# Patient Record
Sex: Male | Born: 1990 | Race: Black or African American | Hispanic: No | Marital: Single | Smoking: Current every day smoker
Health system: Southern US, Community
[De-identification: ages and names within clinical notes are randomized; demographics above are authoritative.]

## PROBLEM LIST (undated history)

## (undated) DIAGNOSIS — Z720 Tobacco use: Secondary | ICD-10-CM

## (undated) DIAGNOSIS — I219 Acute myocardial infarction, unspecified: Secondary | ICD-10-CM

## (undated) DIAGNOSIS — I201 Angina pectoris with documented spasm: Secondary | ICD-10-CM

## (undated) DIAGNOSIS — E663 Overweight: Secondary | ICD-10-CM

## (undated) HISTORY — DX: Overweight: E66.3

## (undated) HISTORY — DX: Tobacco use: Z72.0

---

## 2012-05-29 ENCOUNTER — Encounter (HOSPITAL_COMMUNITY): Payer: Self-pay | Admitting: Anesthesiology

## 2012-05-29 ENCOUNTER — Encounter (HOSPITAL_COMMUNITY): Payer: Self-pay | Admitting: *Deleted

## 2012-05-29 ENCOUNTER — Emergency Department (HOSPITAL_COMMUNITY): Payer: Self-pay | Admitting: Anesthesiology

## 2012-05-29 ENCOUNTER — Ambulatory Visit (HOSPITAL_COMMUNITY)
Admission: EM | Admit: 2012-05-29 | Discharge: 2012-05-29 | Disposition: A | Discharge status: Home / Self Care | Payer: Self-pay | Attending: Emergency Medicine | Admitting: Emergency Medicine

## 2012-05-29 ENCOUNTER — Encounter (HOSPITAL_COMMUNITY)
Admission: EM | Disposition: A | Discharge status: Home / Self Care | Payer: Self-pay | Source: Home / Self Care | Attending: Emergency Medicine

## 2012-05-29 ENCOUNTER — Emergency Department (HOSPITAL_COMMUNITY): Payer: Self-pay

## 2012-05-29 DIAGNOSIS — S0230XA Fracture of orbital floor, unspecified side, initial encounter for closed fracture: Secondary | ICD-10-CM | POA: Insufficient documentation

## 2012-05-29 DIAGNOSIS — Y998 Other external cause status: Secondary | ICD-10-CM | POA: Insufficient documentation

## 2012-05-29 DIAGNOSIS — S022XXA Fracture of nasal bones, initial encounter for closed fracture: Secondary | ICD-10-CM | POA: Insufficient documentation

## 2012-05-29 DIAGNOSIS — S02109A Fracture of base of skull, unspecified side, initial encounter for closed fracture: Secondary | ICD-10-CM | POA: Insufficient documentation

## 2012-05-29 DIAGNOSIS — S0180XA Unspecified open wound of other part of head, initial encounter: Secondary | ICD-10-CM | POA: Insufficient documentation

## 2012-05-29 DIAGNOSIS — Y9229 Other specified public building as the place of occurrence of the external cause: Secondary | ICD-10-CM | POA: Insufficient documentation

## 2012-05-29 DIAGNOSIS — S0231XA Fracture of orbital floor, right side, initial encounter for closed fracture: Secondary | ICD-10-CM

## 2012-05-29 HISTORY — PX: ORIF TRIPOD FRACTURE: SHX5211

## 2012-05-29 SURGERY — FESS, WITH MAXILLARY ANTROSTOMY, SEPTOPLASTY, AND ETHMOIDECTOMY
Anesthesia: General | Site: Face | Laterality: Right | Wound class: Clean Contaminated

## 2012-05-29 MED ORDER — HEMOSTATIC AGENTS (NO CHARGE) OPTIME
TOPICAL | Status: DC | PRN
  Administered 2012-05-29: 4 via TOPICAL

## 2012-05-29 MED ORDER — DROPERIDOL 2.5 MG/ML IJ SOLN: 0.6250 mg | INTRAMUSCULAR | Status: DC | PRN

## 2012-05-29 MED ORDER — BSS IO SOLN
INTRAOCULAR | Status: DC | PRN
  Administered 2012-05-29: 1 via INTRAOCULAR

## 2012-05-29 MED ORDER — LIDOCAINE-EPINEPHRINE 1 %-1:100000 IJ SOLN
INTRAMUSCULAR | Status: AC
  Filled 2012-05-29: qty 1

## 2012-05-29 MED ORDER — TETRACAINE HCL 0.5 % OP SOLN
2.0000 [drp] | Freq: Once | OPHTHALMIC | Status: AC
  Administered 2012-05-29: 2 [drp] via OPHTHALMIC
  Filled 2012-05-29: qty 2

## 2012-05-29 MED ORDER — SODIUM CHLORIDE 0.9 % IV SOLN
3.0000 g | Freq: Once | INTRAVENOUS | Status: AC
  Administered 2012-05-29: 3 g via INTRAVENOUS
  Filled 2012-05-29: qty 3

## 2012-05-29 MED ORDER — DEXTROSE-NACL 5-0.45 % IV SOLN: INTRAVENOUS | Status: DC

## 2012-05-29 MED ORDER — DEXAMETHASONE SODIUM PHOSPHATE 10 MG/ML IJ SOLN
10.0000 mg | Freq: Once | INTRAMUSCULAR | Status: DC
Start: 2012-05-29 — End: 2012-05-29

## 2012-05-29 MED ORDER — OXYMETAZOLINE HCL 0.05 % NA SOLN
NASAL | Status: DC | PRN
  Administered 2012-05-29: 1 via NASAL

## 2012-05-29 MED ORDER — LACTATED RINGERS IV SOLN: INTRAVENOUS | Status: DC | PRN

## 2012-05-29 MED ORDER — LIDOCAINE-EPINEPHRINE (PF) 1 %-1:200000 IJ SOLN
INTRAMUSCULAR | Status: DC | PRN
  Administered 2012-05-29: 7 mL

## 2012-05-29 MED ORDER — MORPHINE SULFATE 2 MG/ML IJ SOLN: 1.0000 mg | INTRAMUSCULAR | Status: DC | PRN

## 2012-05-29 MED ORDER — HYDROMORPHONE HCL PF 1 MG/ML IJ SOLN
INTRAMUSCULAR | Status: AC
  Filled 2012-05-29: qty 1

## 2012-05-29 MED ORDER — OXYCODONE HCL 5 MG/5ML PO SOLN
5.0000 mg | Freq: Once | ORAL | Status: DC | PRN
Start: 2012-05-29 — End: 2012-05-29

## 2012-05-29 MED ORDER — FLUORESCEIN SODIUM 1 MG OP STRP
2.0000 | ORAL_STRIP | Freq: Once | OPHTHALMIC | Status: AC
  Administered 2012-05-29: 2 via OPHTHALMIC
  Filled 2012-05-29: qty 2

## 2012-05-29 MED ORDER — LACTATED RINGERS IV SOLN
INTRAVENOUS | Status: DC | PRN
  Administered 2012-05-29: 12:00:00 via INTRAVENOUS

## 2012-05-29 MED ORDER — ONDANSETRON HCL 4 MG/2ML IJ SOLN
4.0000 mg | Freq: Once | INTRAMUSCULAR | Status: AC
  Administered 2012-05-29: 4 mg via INTRAVENOUS
  Filled 2012-05-29: qty 2

## 2012-05-29 MED ORDER — ROCURONIUM BROMIDE 100 MG/10ML IV SOLN
INTRAVENOUS | Status: DC | PRN
  Administered 2012-05-29: 30 mg via INTRAVENOUS
  Administered 2012-05-29: 10 mg via INTRAVENOUS

## 2012-05-29 MED ORDER — SUCCINYLCHOLINE CHLORIDE 20 MG/ML IJ SOLN
INTRAMUSCULAR | Status: DC | PRN
  Administered 2012-05-29: 140 mg via INTRAVENOUS

## 2012-05-29 MED ORDER — SODIUM CHLORIDE 0.9 % IV SOLN
INTRAVENOUS | Status: DC | PRN
  Administered 2012-05-29: 11:00:00 via INTRAVENOUS

## 2012-05-29 MED ORDER — 0.9 % SODIUM CHLORIDE (POUR BTL) OPTIME
TOPICAL | Status: DC | PRN
  Administered 2012-05-29: 1000 mL

## 2012-05-29 MED ORDER — PROPOFOL 10 MG/ML IV EMUL
INTRAVENOUS | Status: DC | PRN
  Administered 2012-05-29: 150 mg via INTRAVENOUS

## 2012-05-29 MED ORDER — OXYCODONE HCL 5 MG PO TABS: 5.0000 mg | ORAL_TABLET | Freq: Once | ORAL | Status: DC | PRN

## 2012-05-29 MED ORDER — HYDROMORPHONE HCL PF 1 MG/ML IJ SOLN
0.2500 mg | INTRAMUSCULAR | Status: DC | PRN
  Administered 2012-05-29 (×2): 0.5 mg via INTRAVENOUS

## 2012-05-29 MED ORDER — MIDAZOLAM HCL 5 MG/5ML IJ SOLN
INTRAMUSCULAR | Status: DC | PRN
  Administered 2012-05-29: 2 mg via INTRAVENOUS

## 2012-05-29 MED ORDER — LIDOCAINE HCL (CARDIAC) 20 MG/ML IV SOLN
INTRAVENOUS | Status: DC | PRN
  Administered 2012-05-29: 80 mg via INTRAVENOUS

## 2012-05-29 MED ORDER — GLYCOPYRROLATE 0.2 MG/ML IJ SOLN
INTRAMUSCULAR | Status: DC | PRN
  Administered 2012-05-29: 0.4 mg via INTRAVENOUS

## 2012-05-29 MED ORDER — BACITRACIN ZINC 500 UNIT/GM EX OINT
TOPICAL_OINTMENT | CUTANEOUS | Status: DC | PRN
  Administered 2012-05-29: 1 via TOPICAL

## 2012-05-29 MED ORDER — ONDANSETRON HCL 4 MG/2ML IJ SOLN
INTRAMUSCULAR | Status: DC | PRN
  Administered 2012-05-29: 4 mg via INTRAVENOUS

## 2012-05-29 MED ORDER — FENTANYL CITRATE 0.05 MG/ML IJ SOLN
INTRAMUSCULAR | Status: DC | PRN
  Administered 2012-05-29 (×2): 100 ug via INTRAVENOUS
  Administered 2012-05-29: 50 ug via INTRAVENOUS

## 2012-05-29 MED ORDER — BSS IO SOLN
INTRAOCULAR | Status: AC
  Filled 2012-05-29: qty 500

## 2012-05-29 MED ORDER — BSS IO SOLN
INTRAOCULAR | Status: AC
  Filled 2012-05-29: qty 15

## 2012-05-29 MED ORDER — OXYMETAZOLINE HCL 0.05 % NA SOLN
NASAL | Status: AC
  Filled 2012-05-29: qty 15

## 2012-05-29 MED ORDER — BACITRACIN ZINC 500 UNIT/GM EX OINT
TOPICAL_OINTMENT | CUTANEOUS | Status: AC
  Filled 2012-05-29: qty 15

## 2012-05-29 MED ORDER — NEOSTIGMINE METHYLSULFATE 1 MG/ML IJ SOLN
INTRAMUSCULAR | Status: DC | PRN
  Administered 2012-05-29: 3 mg via INTRAVENOUS

## 2012-05-29 SURGICAL SUPPLY — 31 items
ATTRACTOMAT 16X20 MAGNETIC DRP (DRAPES) ×2 IMPLANT
BLADE ROTATE TRICUT 4X13 M4 (BLADE) ×2 IMPLANT
CANISTER SUCTION 2500CC (MISCELLANEOUS) ×2 IMPLANT
CLOTH BEACON ORANGE TIMEOUT ST (SAFETY) ×2 IMPLANT
COAGULATOR SUCT SWTCH 10FR 6 (ELECTROSURGICAL) ×2 IMPLANT
CORDS BIPOLAR (ELECTRODE) ×2 IMPLANT
CRADLE DONUT ADULT HEAD (MISCELLANEOUS) ×2 IMPLANT
ELECT COATED BLADE 2.86 ST (ELECTRODE) ×2 IMPLANT
FILTER ARTHROSCOPY CONVERTOR (FILTER) ×2 IMPLANT
GLOVE SURG SS PI 7.5 STRL IVOR (GLOVE) ×2 IMPLANT
GOWN STRL NON-REIN LRG LVL3 (GOWN DISPOSABLE) ×4 IMPLANT
KIT BASIN OR (CUSTOM PROCEDURE TRAY) ×2 IMPLANT
NEEDLE SPNL 20GX3.5 QUINCKE YW (NEEDLE) ×2 IMPLANT
NS IRRIG 1000ML POUR BTL (IV SOLUTION) ×2 IMPLANT
PAD ARMBOARD 7.5X6 YLW CONV (MISCELLANEOUS) ×2 IMPLANT
PAD ENT ADHESIVE 25PK (MISCELLANEOUS) ×2 IMPLANT
PATTIES SURGICAL .5 X3 (DISPOSABLE) ×2 IMPLANT
PENCIL BUTTON HOLSTER BLD 10FT (ELECTRODE) ×2 IMPLANT
PENCIL FOOT CONTROL (ELECTRODE) ×2 IMPLANT
SHEATH ENDOSCRUB 45 DEG (SHEATH) ×2 IMPLANT
SOLUTION ANTI FOG 6CC (MISCELLANEOUS) ×2 IMPLANT
SPLINT NASAL DOYLE BI-VL (GAUZE/BANDAGES/DRESSINGS) ×2 IMPLANT
SUT CHROMIC 5 0 P 3 (SUTURE) ×2 IMPLANT
SUT PROLENE 5 0 C 1 36 (SUTURE) ×2 IMPLANT
SUT VIC AB 4-0 PS2 27 (SUTURE) ×2 IMPLANT
SYRINGE 10CC LL (SYRINGE) ×2 IMPLANT
TOWEL OR 17X26 10 PK STRL BLUE (TOWEL DISPOSABLE) ×4 IMPLANT
TRACKER ENT INSTRUMENT (MISCELLANEOUS) ×2 IMPLANT
TRACKER ENT PATIENT (MISCELLANEOUS) ×2 IMPLANT
TRAY ENT MC OR (CUSTOM PROCEDURE TRAY) ×2 IMPLANT
TUBE CONNECTING 12X1/4 (SUCTIONS) ×2 IMPLANT
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
## 2012-05-29 NOTE — ED Notes (Signed)
Admitting MD at bedside.

## 2012-05-29 NOTE — ED Notes (Signed)
Pt had to return to CT for further pictures. Pt picture was cut off a little
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
## 2012-05-29 NOTE — ED Notes (Signed)
ENT MD at bedside.

## 2012-05-29 NOTE — Op Note (Signed)
05/29/2012  1:34 PM    Francisco Perez  [id number]   Pre-Op Dx:  Right nasal fracture, right orbital floor blowout fracture  Post-op Dx: Right nasal fracture, right orbital floor blowout fracture  Proc: open reduction and internal fixation of right orbital floor fracture, subciliary approach Closed reduction of nasal fracture with stabilization  Surg:  Shellia Cleverly:  GET  EBL:  <34mL  Comp:  none  Findings:  Right orbital contents well-reduced, no inferior rectus injury, severe nasal swelling, good reduction of nasal fracture. No globe, cornea, pupil, or optic nerve injury.  Drains: nasal Doyle splints  Procedure: With the patient in a comfortable supine position, general endotracheal anesthesia was induced without difficulty.  Afrin pledgets were placed to decongest the nose. The Afrin pledgets were then removed. The image guidance hardware was placed and the patient was registered to the [employer] Fusion image guidance CT.   Nasal endoscopy was performed with the zero degree scope. The septum was mobile and the nasal tissues were edematous. The right lateral nasal wall/nasal dorsum was depressed and fractured from the trauma. I gently reduced the nasal bones outward to their native position, outfractured the turbinates, and I reduced the septum to midline. It was clear that adequate exposure for a transmaxillary ORIF of the right orbital floor fracture could not be obtained, so I removed the endoscope and image guidance hardware. I placed bilateral nasal Doyle splints to bolster the nasal reduction and secured these with a collumellar 3-0 Nylon stitch in the standard fashion after suctioning out the nose.  The face was prepped and draped with betadine and the eyes were protected appropriately. I placed a right temporary tarsorrhaphy with 6-0 prolene.   1% Xylocaine with 1:100,000 epinephrine was infiltrated into the right infraorbital area in the standard fashion. I marked  out a natural subciliary skin crease and incised this down to the subcutaneous tissue with the 15 blade. The muscular layer was divided in the standard fashion and the orbital septum was bluntly dissected away. I identified the right bony orbital rim/floor, and divided the periosteum with the Bovie. I used a hand-over-hand technique with the Cottle elevator and suction to gently dissect periosteum off of the fracture orbital floor. I dissected posteriorly until the entire fracture was exposed. I gently reduced the inferior rectus, which was intact, the periorbital fat, and the periosteum superiorly and back into the orbit. I held the orbital contents superiorly and fashioned a ~3 x 4cm stacked 5-layer piece of Gelfilm. I gently placed this 5-layer Gelfilm inferior to the reduced orbital contents and superior to the bony orbital floor, spanning the fractured orbital floor and resting on the stable lateral bony floor and anterior bony rim. Excellent reduction was noted. I then removed the tarsorrhaphy and performed forced duction testing in the standard fashion and confirmed good superior, inferior, lateral, and medial motion of the right globe and no evidence of entrapment on forced duction. Care was taken not to injure the cornea. I carefully irrigated out the subciliary incision and then closed the muscular layer loosely with deep, buried 4-0 vicryl, and closed the skin with interrupted simple 5-0 chromic gut. The stomach was suctioned out and the face cleaned off. The eyes were gently rinsed with balanced saline solution (sterile). Again good reduction was noted with no rectus muscle injury or entrapment.  The patient was returned to anesthesia, fully awakened and extubated.  The patient was transferred to the postoperative recovery area in stable condition.  Dr.  Melvenia Beam was present and performed the entire procedure.   Melvenia Beam 1:34 PM 05/29/12

## 2012-05-29 NOTE — ED Notes (Addendum)
EDP at bedside  

## 2012-05-29 NOTE — Transfer of Care (Signed)
Immediate Anesthesia Transfer of Care Note  Patient: Francisco Perez  Procedure(s) Performed: Procedure(s) (LRB): SEPTOPLASTY WITH ETHMOIDECTOMY, AND MAXILLARY ANTROSTOMY (Right) OPEN REDUCTION INTERNAL FIXATION (ORIF) TRIPOD FRACTURE (Right)  Patient Location: PACU  Anesthesia Type: General  Level of Consciousness: awake, alert , oriented and patient cooperative  Airway & Oxygen Therapy: Patient Spontanous Breathing and Patient connected to face mask oxygen  Post-op Assessment: Report given to PACU RN, Post -op Vital signs reviewed and stable and Patient moving all extremities  Post vital signs: Reviewed and stable  Complications: No apparent anesthesia complications

## 2012-05-29 NOTE — Anesthesia Procedure Notes (Signed)
Procedure Name: Intubation Date/Time: 05/29/2012 11:15 AM Performed by: Jerilee Hoh Pre-anesthesia Checklist: Patient identified, Emergency Drugs available, Suction available and Patient being monitored Patient Re-evaluated:Patient Re-evaluated prior to inductionOxygen Delivery Method: Circle system utilized Preoxygenation: Pre-oxygenation with 100% oxygen Intubation Type: Cricoid Pressure applied, Rapid sequence and IV induction Laryngoscope Size: Mac and 4 Grade View: Grade I Tube type: Oral Tube size: 7.5 mm Number of attempts: 1 Airway Equipment and Method: Stylet Placement Confirmation: ETT inserted through vocal cords under direct vision,  positive ETCO2 and breath sounds checked- equal and bilateral Secured at: 22 cm Tube secured with: Tape Dental Injury: Teeth and Oropharynx as per pre-operative assessment  Comments: RSI with cricoid pressure held.  Suctioned blood from oropharynx prior to inserting ETT.  Grade I view.  Atraumatic intubation.

## 2012-05-29 NOTE — ED Notes (Signed)
EDP removed C-collar
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
## 2012-05-29 NOTE — Preoperative (Signed)
Beta Blockers   Reason not to administer Beta Blockers:Not Applicable 

## 2012-05-29 NOTE — ED Notes (Signed)
Attempted to perform wound care; Patient began vomiting; PA informed

## 2012-05-29 NOTE — Anesthesia Postprocedure Evaluation (Signed)
Anesthesia Post Note  Patient: Francisco Perez  Procedure(s) Performed: Procedure(s) (LRB): SEPTOPLASTY WITH ETHMOIDECTOMY, AND MAXILLARY ANTROSTOMY (Right) OPEN REDUCTION INTERNAL FIXATION (ORIF) TRIPOD FRACTURE (Right)  Anesthesia type: general  Patient location: PACU  Post pain: Pain level controlled  Post assessment: Patient's Cardiovascular Status Stable  Last Vitals:  Filed Vitals:   05/29/12 1538  BP:   Pulse: 70  Temp:   Resp: 15    Post vital signs: Reviewed and stable  Level of consciousness: sedated  Complications: No apparent anesthesia complications

## 2012-06-01 ENCOUNTER — Encounter (HOSPITAL_COMMUNITY): Payer: Self-pay | Admitting: Otolaryngology

## 2012-10-01 ENCOUNTER — Emergency Department: Payer: Self-pay | Admitting: Emergency Medicine

## 2012-10-05 ENCOUNTER — Emergency Department: Payer: Self-pay | Admitting: Emergency Medicine

## 2012-10-05 LAB — CBC WITH DIFFERENTIAL/PLATELET
Eosinophil #: 0.1 10*3/uL (ref 0.0–0.7)
Eosinophil %: 1.1 %
HGB: 13.8 g/dL (ref 13.0–18.0)
Lymphocyte #: 2.9 10*3/uL (ref 1.0–3.6)
MCHC: 33.1 g/dL (ref 32.0–36.0)
Neutrophil #: 7.5 10*3/uL — ABNORMAL HIGH (ref 1.4–6.5)
Platelet: 215 10*3/uL (ref 150–440)
RBC: 4.56 10*6/uL (ref 4.40–5.90)
RDW: 13.4 % (ref 11.5–14.5)

## 2012-10-05 LAB — BASIC METABOLIC PANEL
Anion Gap: 4 — ABNORMAL LOW (ref 7–16)
Chloride: 101 mmol/L (ref 98–107)
Creatinine: 1 mg/dL (ref 0.60–1.30)
EGFR (African American): >60
EGFR (Non-African Amer.): >60
Glucose: 90 mg/dL (ref 65–99)
Osmolality: 275 (ref 275–301)
Potassium: 4.3 mmol/L (ref 3.5–5.1)
Sodium: 137 mmol/L (ref 136–145)

## 2016-01-13 ENCOUNTER — Encounter (HOSPITAL_COMMUNITY)
Admission: AD | Disposition: A | Discharge status: Home / Self Care | Payer: Self-pay | Source: Other Acute Inpatient Hospital | Attending: Cardiovascular Disease

## 2016-01-13 ENCOUNTER — Emergency Department: Payer: Self-pay

## 2016-01-13 ENCOUNTER — Encounter: Payer: Self-pay | Admitting: Emergency Medicine

## 2016-01-13 ENCOUNTER — Inpatient Hospital Stay (HOSPITAL_COMMUNITY)
Admission: AD | Admit: 2016-01-13 | Discharge: 2016-01-14 | DRG: 287 | Disposition: A | Discharge status: Home / Self Care | Payer: Self-pay | Source: Other Acute Inpatient Hospital | Attending: Cardiovascular Disease | Admitting: Cardiovascular Disease

## 2016-01-13 ENCOUNTER — Emergency Department
Admission: EM | Admit: 2016-01-13 | Discharge: 2016-01-13 | Disposition: A | Discharge status: Home / Self Care | Payer: Self-pay | Attending: Emergency Medicine | Admitting: Emergency Medicine

## 2016-01-13 ENCOUNTER — Inpatient Hospital Stay (HOSPITAL_COMMUNITY): Payer: Self-pay

## 2016-01-13 DIAGNOSIS — F17210 Nicotine dependence, cigarettes, uncomplicated: Secondary | ICD-10-CM | POA: Insufficient documentation

## 2016-01-13 DIAGNOSIS — R9431 Abnormal electrocardiogram [ECG] [EKG]: Secondary | ICD-10-CM | POA: Diagnosis present

## 2016-01-13 DIAGNOSIS — I2119 ST elevation (STEMI) myocardial infarction involving other coronary artery of inferior wall: Secondary | ICD-10-CM

## 2016-01-13 DIAGNOSIS — I472 Ventricular tachycardia: Secondary | ICD-10-CM | POA: Diagnosis present

## 2016-01-13 DIAGNOSIS — R7989 Other specified abnormal findings of blood chemistry: Secondary | ICD-10-CM

## 2016-01-13 DIAGNOSIS — I2510 Atherosclerotic heart disease of native coronary artery without angina pectoris: Secondary | ICD-10-CM

## 2016-01-13 DIAGNOSIS — F17200 Nicotine dependence, unspecified, uncomplicated: Secondary | ICD-10-CM | POA: Diagnosis present

## 2016-01-13 DIAGNOSIS — I201 Angina pectoris with documented spasm: Principal | ICD-10-CM | POA: Diagnosis present

## 2016-01-13 DIAGNOSIS — R778 Other specified abnormalities of plasma proteins: Secondary | ICD-10-CM | POA: Diagnosis present

## 2016-01-13 DIAGNOSIS — Z720 Tobacco use: Secondary | ICD-10-CM

## 2016-01-13 DIAGNOSIS — R079 Chest pain, unspecified: Secondary | ICD-10-CM

## 2016-01-13 HISTORY — DX: Angina pectoris with documented spasm: I20.1

## 2016-01-13 HISTORY — PX: CARDIAC CATHETERIZATION: SHX172

## 2016-01-13 LAB — ECHOCARDIOGRAM COMPLETE
Height: 72 in
WEIGHTICAEL: 3360 [oz_av]

## 2016-01-13 LAB — POCT I-STAT, CHEM 8
BUN: 13 mg/dL (ref 6–20)
CREATININE: 0.7 mg/dL (ref 0.61–1.24)
Calcium, Ion: 1.13 mmol/L (ref 1.12–1.23)
Chloride: 104 mmol/L (ref 101–111)
Glucose, Bld: 101 mg/dL — ABNORMAL HIGH (ref 65–99)
HEMATOCRIT: 44 % (ref 39.0–52.0)
HEMOGLOBIN: 15 g/dL (ref 13.0–17.0)
POTASSIUM: 4.2 mmol/L (ref 3.5–5.1)
Sodium: 141 mmol/L (ref 135–145)
TCO2: 23 mmol/L (ref 0–100)

## 2016-01-13 LAB — CBC
HEMATOCRIT: 42.4 % (ref 40.0–52.0)
Hemoglobin: 14.3 g/dL (ref 13.0–18.0)
MCH: 31 pg (ref 26.0–34.0)
MCHC: 33.8 g/dL (ref 32.0–36.0)
MCV: 91.6 fL (ref 80.0–100.0)
PLATELETS: 240 10*3/uL (ref 150–440)
RBC: 4.63 MIL/uL (ref 4.40–5.90)
RDW: 13.3 % (ref 11.5–14.5)
WBC: 14.3 10*3/uL — AB (ref 3.8–10.6)

## 2016-01-13 LAB — TROPONIN I
TROPONIN I: 0.54 ng/mL — AB (ref <0.031)
TROPONIN I: 6.14 ng/mL — AB (ref <0.031)
Troponin I: 4.71 ng/mL (ref <0.031)

## 2016-01-13 SURGERY — LEFT HEART CATH AND CORONARY ANGIOGRAPHY
Anesthesia: LOCAL

## 2016-01-13 SURGERY — Surgical Case
Anesthesia: *Unknown

## 2016-01-13 MED ORDER — ASPIRIN 81 MG PO CHEW
CHEWABLE_TABLET | ORAL | Status: AC
  Administered 2016-01-13: 324 mg via ORAL
  Filled 2016-01-13: qty 4

## 2016-01-13 MED ORDER — HEPARIN SODIUM (PORCINE) 1000 UNIT/ML IJ SOLN
INTRAMUSCULAR | Status: AC
  Filled 2016-01-13: qty 1

## 2016-01-13 MED ORDER — HEPARIN SODIUM (PORCINE) 1000 UNIT/ML IJ SOLN
INTRAMUSCULAR | Status: DC | PRN
  Administered 2016-01-13: 5000 [IU] via INTRAVENOUS

## 2016-01-13 MED ORDER — IOPAMIDOL (ISOVUE-370) INJECTION 76%
INTRAVENOUS | Status: AC
  Filled 2016-01-13: qty 100

## 2016-01-13 MED ORDER — SODIUM CHLORIDE 0.9 % WEIGHT BASED INFUSION: 1.0000 mL/kg/h | INTRAVENOUS | Status: AC

## 2016-01-13 MED ORDER — IOPAMIDOL (ISOVUE-370) INJECTION 76%
INTRAVENOUS | Status: DC | PRN
  Administered 2016-01-13: 120 mL via INTRA_ARTERIAL

## 2016-01-13 MED ORDER — HEPARIN SODIUM (PORCINE) 5000 UNIT/ML IJ SOLN
INTRAMUSCULAR | Status: AC
  Administered 2016-01-13: 4000 [IU] via INTRAVENOUS
  Filled 2016-01-13: qty 1

## 2016-01-13 MED ORDER — ASPIRIN 81 MG PO CHEW
324.0000 mg | CHEWABLE_TABLET | Freq: Once | ORAL | Status: AC
  Administered 2016-01-13: 324 mg via ORAL

## 2016-01-13 MED ORDER — NITROGLYCERIN 1 MG/10 ML FOR IR/CATH LAB
INTRA_ARTERIAL | Status: AC
  Filled 2016-01-13: qty 10

## 2016-01-13 MED ORDER — MORPHINE SULFATE (PF) 2 MG/ML IV SOLN: 2.0000 mg | INTRAVENOUS | Status: DC | PRN

## 2016-01-13 MED ORDER — LIDOCAINE HCL (PF) 1 % IJ SOLN
INTRAMUSCULAR | Status: DC | PRN
  Administered 2016-01-13: 5 mL

## 2016-01-13 MED ORDER — ACETAMINOPHEN 325 MG PO TABS: 650.0000 mg | ORAL_TABLET | ORAL | Status: DC | PRN

## 2016-01-13 MED ORDER — ONDANSETRON HCL 4 MG/2ML IJ SOLN
4.0000 mg | Freq: Four times a day (QID) | INTRAMUSCULAR | Status: DC | PRN
Start: 2016-01-13 — End: 2016-01-14

## 2016-01-13 MED ORDER — HEPARIN SODIUM (PORCINE) 5000 UNIT/ML IJ SOLN
4000.0000 [IU] | Freq: Once | INTRAMUSCULAR | Status: AC
  Administered 2016-01-13: 4000 [IU] via INTRAVENOUS

## 2016-01-13 MED ORDER — IOPAMIDOL (ISOVUE-370) INJECTION 76%
INTRAVENOUS | Status: AC
Start: 2016-01-13 — End: 2016-01-13
  Filled 2016-01-13: qty 50

## 2016-01-13 MED ORDER — HEPARIN (PORCINE) IN NACL 2-0.9 UNIT/ML-% IJ SOLN
INTRAMUSCULAR | Status: AC
  Filled 2016-01-13: qty 1000

## 2016-01-13 MED ORDER — SODIUM CHLORIDE 0.9% FLUSH: 3.0000 mL | INTRAVENOUS | Status: DC | PRN

## 2016-01-13 MED ORDER — PERFLUTREN LIPID MICROSPHERE
INTRAVENOUS | Status: AC
  Administered 2016-01-13: 2 mL
  Filled 2016-01-13: qty 10

## 2016-01-13 MED ORDER — HEPARIN (PORCINE) IN NACL 2-0.9 UNIT/ML-% IJ SOLN
INTRAMUSCULAR | Status: DC | PRN
  Administered 2016-01-13: 1000 mL

## 2016-01-13 MED ORDER — VERAPAMIL HCL 2.5 MG/ML IV SOLN
INTRA_ARTERIAL | Status: DC | PRN
  Administered 2016-01-13: 5 mL via INTRA_ARTERIAL

## 2016-01-13 MED ORDER — SODIUM CHLORIDE 0.9 % IV SOLN: 250.0000 mL | INTRAVENOUS | Status: DC | PRN

## 2016-01-13 MED ORDER — SODIUM CHLORIDE 0.9% FLUSH
3.0000 mL | Freq: Two times a day (BID) | INTRAVENOUS | Status: DC
  Administered 2016-01-13 – 2016-01-14 (×2): 3 mL via INTRAVENOUS

## 2016-01-13 MED ORDER — LIDOCAINE HCL (PF) 1 % IJ SOLN
INTRAMUSCULAR | Status: AC
  Filled 2016-01-13: qty 30

## 2016-01-13 MED ORDER — ASPIRIN 81 MG PO CHEW
81.0000 mg | CHEWABLE_TABLET | Freq: Every day | ORAL | Status: DC
  Administered 2016-01-14: 81 mg via ORAL
  Filled 2016-01-13: qty 1

## 2016-01-13 SURGICAL SUPPLY — 13 items
CATH INFINITI 5FR ANG PIGTAIL (CATHETERS) ×2 IMPLANT
CATH INFINITI JR4 5F (CATHETERS) ×2 IMPLANT
CATH OPTITORQUE TIG 4.0 5F (CATHETERS) ×2 IMPLANT
ELECT DEFIB PAD ADLT CADENCE (PAD) ×2 IMPLANT
GLIDESHEATH SLEND A-KIT 6F 22G (SHEATH) ×2 IMPLANT
KIT ENCORE 26 ADVANTAGE (KITS) ×2 IMPLANT
KIT HEART LEFT (KITS) ×2 IMPLANT
PACK CARDIAC CATHETERIZATION (CUSTOM PROCEDURE TRAY) ×2 IMPLANT
SYR MEDRAD MARK V 150ML (SYRINGE) ×2 IMPLANT
TRANSDUCER W/STOPCOCK (MISCELLANEOUS) ×2 IMPLANT
TUBING CIL FLEX 10 FLL-RA (TUBING) ×2 IMPLANT
WIRE HI TORQ VERSACORE-J 145CM (WIRE) ×2 IMPLANT
WIRE SAFE-T 1.5MM-J .035X260CM (WIRE) ×2 IMPLANT

## 2016-01-13 NOTE — ED Notes (Signed)
C/O chest pain and arm pain.  Pain woke patient up this morning.  Reports some nausea with pain.

## 2016-01-13 NOTE — H&P (Signed)
Patient ID: DASHIELL FRANCHINO MRN: [id number], DOB/AGE: May 27, 1991   Admit date: 01/13/2016   Primary Physician: No primary care provider on file. Primary Cardiologist: New  Pt. Profile:  Francisco Perez is a 25 y.o. male with no prior medical history  who transferred from [facility] with code STEMI for emergent cath.    HPI: As above. The patient woke up from sleep around 5 am with chest pain. The describes the pain as stabbing at substernal area. The pain was associated with SOB, nausea and vomiting. NO prior hx of similar pain. Initially the pain was constant and now intermittent since left [facility]. Given asa  and heparin.  He used marijuana last night with 3 beers. No cocaine use. Hx of tobacco smoking, smokes 1/2 pack for the past 6 years. No family hx of early cardiac disease. The patient denies nausea, vomiting, fever, palpitations,orthopnea, PND, dizziness, syncope, cough, congestion, abdominal pain, hematochezia, melena, lower extremity edema. No prior cardiac hx as child. Works as Corporate investment banker.   The patient went to [facility] where EKG showed minimal inferior lateral ST elevation and transferred to [facility] for emergent cath.   Problem List  History reviewed. No pertinent past medical history.  Past Surgical History  Procedure Laterality Date  . Orif tripod fracture  05/29/2012    Procedure: OPEN REDUCTION INTERNAL FIXATION (ORIF) TRIPOD FRACTURE;  Surgeon: Melvenia Beam, MD;  Location: [facility] OR;  Service: ENT;  Laterality: Right;     Allergies  No Known Allergies   Home Medications  None      Family History States that mom side has hx of cardiac problem, however unable to provide any further info. Mother has HTN and DM.   Social History  Social History   Social History  . Marital Status: Single    Spouse Name: N/A  . Number of Children: N/A  . Years of Education: N/A   Occupational History  . Not on file.   Social History Main Topics  . Smoking status:  Current Every Day Smoker -- 0.50 packs/day    Types: Cigarettes  . Smokeless tobacco: Not on file  . Alcohol Use: Yes  . Drug Use: Yes    Special: Marijuana  . Sexual Activity: Not on file   Other Topics Concern  . Not on file   Social History Narrative     All other systems reviewed and are otherwise negative except as noted above.  Physical Exam  Blood pressure 147/87, pulse 61, temperature 97.9 F (36.6 C), resp. rate 17, height 6' (1.829 m), weight 21 lb (9.526 kg), SpO2 99 %.  General: Pleasant, NAD Psych: Normal affect. Neuro: Alert and oriented X 3. Moves all extremities spontaneously. HEENT: Normal  Neck: Supple without bruits or JVD. Lungs:  Resp regular and unlabored, CTA. Heart: RRR no s3, s4, or murmurs. Abdomen: Soft, non-tender, non-distended, BS + x 4.  Extremities: No clubbing, cyanosis or edema. DP/PT/Radials 2+ and equal bilaterally.  Labs  No results for input(s): CKTOTAL, CKMB, TROPONINI in the last 72 hours. Lab Results  Component Value Date   WBC 11.9* 10/05/2012   HGB 13.8 10/05/2012   HCT 41.6 10/05/2012   MCV 91 10/05/2012   PLT 215 10/05/2012   No results for input(s): NA, K, CL, CO2, BUN, CREATININE, CALCIUM, PROT, BILITOT, ALKPHOS, ALT, AST, GLUCOSE in the last 168 hours.  Invalid input(s): LABALBU No results found for: CHOL, HDL, LDLCALC, TRIG No results found for: DDIMER   Radiology/Studies  No results found.  ECG   EKG showed minimal inferior lateral ST elevation with ? Early repolarization.   ASSESSMENT AND PLAN   1. STEMI - Onset of symptoms 5 am. Currently have 7/10 chest pressure.  EKG showed ST elevation in inferior lateral leads. Pending emergent cath and labs.   Signed, Manson PasseyBhagat,Marena Witts, PA-C 01/13/2016, 7:56 AM Pager [phone]

## 2016-01-13 NOTE — ED Provider Notes (Signed)
[facility] Emergency Department Provider Note  ____________________________________________    I have reviewed the triage vital signs and the nursing notes.   HISTORY  Chief Complaint Chest Pain    HPI Francisco Perez is a 25 y.o. male who presents with complaints of chest pain. Patient reports stabbing severe pain substernally and a burning sensation in his bilateral arms. He has never had this before. This woke him from sleep. He did drink alcohol and smoke marijuana last night but denies cocaine use. No fevers or chills. No recent URI. No history of heart disease at young age. He does smoke cigarettes.     History reviewed. No pertinent past medical history.  There are no active problems to display for this patient.   Past Surgical History  Procedure Laterality Date  . Orif tripod fracture  05/29/2012    Procedure: OPEN REDUCTION INTERNAL FIXATION (ORIF) TRIPOD FRACTURE;  Surgeon: Melvenia Beam, MD;  Location: [facility] OR;  Service: ENT;  Laterality: Right;    No current outpatient prescriptions on file.  Allergies Review of patient's allergies indicates no known allergies.  No family history on file.  Social History Social History  Substance Use Topics  . Smoking status: Current Every Day Smoker -- 0.50 packs/day    Types: Cigarettes  . Smokeless tobacco: None  . Alcohol Use: Yes    Review of Systems  Constitutional: Negative for fever. Eyes: Negative for redness ENT: Negative for sore throat Cardiovascular:As above Respiratory: Negative for shortness of breath. Gastrointestinal: Positive for nausea Genitourinary: Negative for dysuria. Musculoskeletal: Negative for back pain. Skin: Negative for rash. Neurological: Negative for focal weakness Psychiatric: no anxiety    ____________________________________________   PHYSICAL EXAM:  VITAL SIGNS: ED Triage Vitals  Enc Vitals Group     BP 01/13/16 0701 125/76 mmHg     Pulse Rate  01/13/16 0701 53     Resp 01/13/16 0701 18     Temp 01/13/16 0701 97.9 F (36.6 C)     Temp src --      SpO2 01/13/16 0701 98 %     Weight 01/13/16 0701 21 lb (9.526 kg)     Height 01/13/16 0701 6' (1.829 m)     Head Cir --      Peak Flow --      Pain Score 01/13/16 0703 8     Pain Loc --      Pain Edu? --      Excl. in GC? --      Constitutional: Alert and oriented. Uncomfortable appearing Eyes: Conjunctivae are normal. No erythema or injection ENT   Head: Normocephalic and atraumatic.   Mouth/Throat: Mucous membranes are moist. Cardiovascular: Normal rate, regular rhythm. Normal and symmetric distal pulses are present in the upper extremities. No murmurs or rubs , normal symmetric pulses in the lower extremities as well Respiratory: Normal respiratory effort without tachypnea nor retractions. Breath sounds are clear and equal bilaterally.  Gastrointestinal: Soft and non-tender in all quadrants. No distention. There is no CVA tenderness. Genitourinary: deferred Musculoskeletal: Nontender with normal range of motion in all extremities. No lower extremity tenderness nor edema. Neurologic:  Normal speech and language. No gross focal neurologic deficits are appreciated. Skin:  Skin is warm, dry and intact. No rash noted. Psychiatric: Mood and affect are normal. Patient exhibits appropriate insight and judgment.  ____________________________________________    LABS (pertinent positives/negatives)  Labs Reviewed  BASIC METABOLIC PANEL  CBC  TROPONIN I    ____________________________________________  EKG  1. ED ECG REPORT I, Jene EveryKINNER, Estella Malatesta, the attending physician, personally viewed and interpreted this ECG.   Date: 01/13/2016  EKG Time: 7:01 AM  Rate: 51  Rhythm: sinus bradycardia  Axis: Normal axis  Intervals:none  ST&T Change: Elevation in lead 2, nondiagnostic  2.   ED ECG REPORT I, Jene EveryKINNER, Daichi Moris, the attending physician, personally viewed and  interpreted this ECG.   Date: 01/13/2016  EKG Time: 7:31 AM  Rate: 65  Rhythm: normal sinus rhythm  Axis: Normal axis  Intervals:none  ST&T Change: Elevation in inferior leads concerning for ST elevation MI  STEMI called after this EKG, discussed with Dr. Allyson SabalBerry who studied the EKG and agrees    ____________________________________________    RADIOLOGY  Chest x-ray, unremarkable mediastinum  ____________________________________________   PROCEDURES  Procedure(s) performed: none  Critical Care performed:yes  CRITICAL CARE Performed by: Jene EveryKINNER, Ariadna Setter   Total critical care time: 20 minutes  Critical care time was exclusive of separately billable procedures and treating other patients.  Critical care was necessary to treat or prevent imminent or life-threatening deterioration.  Critical care was time spent personally by me on the following activities: development of treatment plan with patient and/or surrogate as well as nursing, discussions with consultants, evaluation of patient's response to treatment, examination of patient, obtaining history from patient or surrogate, ordering and performing treatments and interventions, ordering and review of laboratory studies, ordering and review of radiographic studies, pulse oximetry and re-evaluation of patient's condition.  ____________________________________________   INITIAL IMPRESSION / ASSESSMENT AND PLAN / ED COURSE  Pertinent labs & imaging results that were available during my care of the patient were reviewed by me and considered in my medical decision making (see chart for details).  Patient presented with chest pain. Reportedly this woke him from sleep. Mother reports an episode of nausea from the pain. Initial EKG nondiagnostic, second EKG concerning for elevations inferiorly. STEMI alert called. Discussed this with Dr. Allyson SabalBerry of cardiology at [facility] who agrees with heparin and aspirin and transfer.  Discussed  concerns with patient and family.  ____________________________________________   FINAL CLINICAL IMPRESSION(S) / ED DIAGNOSES  Final diagnoses:  ST elevation myocardial infarction (STEMI) of inferior wall (HCC)          Jene Everyobert Makayla Confer, MD 01/13/16 [phone]

## 2016-01-13 NOTE — Progress Notes (Signed)
Utilization review completed.  

## 2016-01-13 NOTE — Progress Notes (Signed)
Echocardiogram 2D Echocardiogram with Definity has been performed.  Nolon RodBrown, Tony 01/13/2016, 11:21 AM

## 2016-01-13 NOTE — Progress Notes (Signed)
Critical troponin called to Cardiology PA on call. No action needed at this time.   Bernd Crom, Charlaine DaltonAnn Brooke RN

## 2016-01-13 NOTE — ED Provider Notes (Addendum)
I did not approve discontinuing the troponin and CMP. I am not sure why this is listed under my name.  Jene Everyobert Kamiryn Bezanson, MD 01/13/16 [phone]   Apparently labs were hemolyzed per lab  Jene Everyobert Chaley Castellanos, MD 01/13/16 [phone]

## 2016-01-13 NOTE — Progress Notes (Signed)
TR band taken off at 3pm no issues dressing applied.  Wilber Fini, Charlaine DaltonAnn Brooke RN

## 2016-01-14 ENCOUNTER — Other Ambulatory Visit: Payer: Self-pay | Admitting: Student

## 2016-01-14 ENCOUNTER — Telehealth: Payer: Self-pay | Admitting: *Deleted

## 2016-01-14 ENCOUNTER — Encounter (HOSPITAL_COMMUNITY): Payer: Self-pay | Admitting: Cardiovascular Disease

## 2016-01-14 DIAGNOSIS — I214 Non-ST elevation (NSTEMI) myocardial infarction: Secondary | ICD-10-CM

## 2016-01-14 DIAGNOSIS — R7989 Other specified abnormal findings of blood chemistry: Secondary | ICD-10-CM

## 2016-01-14 DIAGNOSIS — Z720 Tobacco use: Secondary | ICD-10-CM

## 2016-01-14 DIAGNOSIS — R778 Other specified abnormalities of plasma proteins: Secondary | ICD-10-CM

## 2016-01-14 DIAGNOSIS — I201 Angina pectoris with documented spasm: Secondary | ICD-10-CM

## 2016-01-14 LAB — BASIC METABOLIC PANEL
Anion gap: 9 (ref 5–15)
BUN: 12 mg/dL (ref 6–20)
CO2: 27 mmol/L (ref 22–32)
Calcium: 9.3 mg/dL (ref 8.9–10.3)
Chloride: 105 mmol/L (ref 101–111)
Creatinine, Ser: 0.93 mg/dL (ref 0.61–1.24)
GFR calc Af Amer: >60 mL/min (ref >60)
GLUCOSE: 99 mg/dL (ref 65–99)
POTASSIUM: 4.1 mmol/L (ref 3.5–5.1)
Sodium: 141 mmol/L (ref 135–145)

## 2016-01-14 LAB — CBC
HEMATOCRIT: 42.8 % (ref 39.0–52.0)
Hemoglobin: 14 g/dL (ref 13.0–17.0)
MCH: 30.4 pg (ref 26.0–34.0)
MCHC: 32.7 g/dL (ref 30.0–36.0)
MCV: 93 fL (ref 78.0–100.0)
Platelets: 227 10*3/uL (ref 150–400)
RBC: 4.6 MIL/uL (ref 4.22–5.81)
RDW: 13.2 % (ref 11.5–15.5)
WBC: 9.9 10*3/uL (ref 4.0–10.5)

## 2016-01-14 MED ORDER — ASPIRIN 81 MG PO CHEW: 81.0000 mg | CHEWABLE_TABLET | Freq: Every day | ORAL | Status: AC

## 2016-01-14 MED ORDER — METOPROLOL TARTRATE 12.5 MG HALF TABLET: 12.5000 mg | ORAL_TABLET | Freq: Two times a day (BID) | ORAL | Status: DC

## 2016-01-14 MED ORDER — NITROGLYCERIN 0.4 MG SL SUBL: 0.4000 mg | SUBLINGUAL_TABLET | SUBLINGUAL | Status: AC | PRN

## 2016-01-14 NOTE — Telephone Encounter (Signed)
Invalid number for patient.

## 2016-01-14 NOTE — Telephone Encounter (Signed)
-----   Message from Coralee RudSabrina F Gilley sent at 01/14/2016  1:13 PM EDT ----- Regarding: tcm/ph 5/4 Nicolasa Duckinghristopher Berge, NP 10:00

## 2016-01-14 NOTE — Progress Notes (Signed)
Hospital Problem List     Principal Problem:   Chest pain Active Problems:   Elevated troponin    Patient Profile:   Primary Cardiologist: New - Dr. Allyson Sabal   25 yo male w/ past medical history of tobacco abuse who presented to [facility] from [facility] on 01/13/2016 as a CODE STEMI. Cath showed normal cors.  Subjective   Patient reports his pain resolved following his catheterization. Was previously a stabbing pain, present for several hours. Reports palpitations overnight and this AM. Denies any recent sick exposures.   Inpatient Medications    . aspirin  81 mg Oral Daily  . metoprolol tartrate  12.5 mg Oral BID  . sodium chloride flush  3 mL Intravenous Q12H    Vital Signs    Filed Vitals:   01/13/16 1401 01/13/16 1455 01/13/16 2006 01/14/16 0534  BP: 122/71  133/69 118/65  Pulse: 59  57 79  Temp: 98.2 F (36.8 C)  98.2 F (36.8 C) 97.8 F (36.6 C)  TempSrc: Oral  Oral Oral  Resp: Weight:      SpO2: 99% 99% 99% 99%    Intake/Output Summary (Last 24 hours) at 01/14/16 1003 Last data filed at 01/14/16 0951  Gross per 24 hour  Intake    123 ml  Output      0 ml  Net    123 ml   Filed Weights   01/13/16 0800  Weight: 210 lb (95.255 kg)    Physical Exam    General: Well developed, well nourished, male appearing in no acute distress. Head: Normocephalic, atraumatic.  Neck: Supple without bruits, JVD not elevated. Lungs:  Resp regular and unlabored, CTA without wheezing or rales. Heart: RRR, S1, S2, no S3, S4, or murmur; no rub. Abdomen: Soft, non-tender, non-distended with normoactive bowel sounds. No hepatomegaly. No rebound/guarding. No obvious abdominal masses. Extremities: No clubbing, cyanosis, or edema. Distal pedal pulses are 2+ bilaterally. Neuro: Alert and oriented X 3. Moves all extremities spontaneously. Psych: Normal affect.  Labs    CBC  Recent Labs  01/13/16 0712 01/13/16 0843 01/14/16 0303  WBC 14.3*  --  9.9  HGB 14.3 15.0 14.0    HCT 42.4 44.0 42.8  MCV 91.6  --  93.0  PLT 240  --  227   Basic Metabolic Panel  Recent Labs  01/13/16 0843 01/14/16 0303  NA 141 141  K 4.2 4.1  CL 104 105  CO2  --  27  GLUCOSE 101* 99  BUN 13 12  CREATININE 0.70 0.93  CALCIUM  --  9.3   Liver Function Tests No results for input(s): AST, ALT, ALKPHOS, BILITOT, PROT, ALBUMIN in the last 72 hours. No results for input(s): LIPASE, AMYLASE in the last 72 hours. Cardiac Enzymes  Recent Labs  01/13/16 0939 01/13/16 1444 01/13/16 2110  TROPONINI 0.54* 4.71* 6.14*     Telemetry    NSR, HR in 60's - 70's. 5 beats NSVT overnight. Occasional PVC's.  ECG    Sinus bradycardia, HR 51, diffuse inferolateral ST elevation.   Cardiac Studies and Radiology    Dg Chest Portable 1 View: 01/13/2016  CLINICAL DATA:  25 year old male with shooting pain down both arms this morning. Sharp pain in the chest starting today. Nausea. EXAM: PORTABLE CHEST 1 VIEW COMPARISON:  No priors. FINDINGS: Lung volumes are normal. No consolidative airspace disease. No pleural effusions. No pneumothorax. No pulmonary nodule or mass noted. Pulmonary vasculature and the cardiomediastinal  silhouette are within normal limits. IMPRESSION: No radiographic evidence of acute cardiopulmonary disease. Electronically Signed   By: Trudie Reedaniel  Entrikin M.D.   On: 01/13/2016 08:07   Echocardiogram: 01/13/2016 Study Conclusions - Left ventricle: The cavity size was normal. Systolic function was  normal. The estimated ejection fraction was in the range of 55%  to 60%. Wall motion was normal; there were no regional wall  motion abnormalities. Left ventricular diastolic function  parameters were normal.  Cardiac Catheterization: 01/13/2016 Mr. Kateri PlummerMorrow had normal coronary arteries and low normal LV function with an EF in the 45-50% range. He didn't appear to be enlarged. The sheath was removed and a TR band was placed on the right wrist to achieve hemostasis. The patient  left the lab in stable condition. Cardiac enzymes will be cycled The echocardiogram will be obtained.  Assessment & Plan    1. Chest Pain/ Elevated Troponin - presented with a stabbing pain in the substernal area associated with nausea and shortness of breath. No positional changes noted.  - while at [facility] his EKG showed inferolateral ST elevation and was transferred [facility] as a CODE STEMI. Cardiac cath showed normal coronary arteries. Echo shows EF of 55-60% with no evidence of pericardial effusion. - cyclic troponin values have been 0.54, 4.71, and 6.14. - Possible etiology of pericarditis vs. coronary vasospasm.   2. NSVT - noted on telemetry. Patient reports a history of this. - will initiate low-dose BB.  3. Tobacco Abuse - smoking cessation advised.  Leonides SchanzSigned, Ellsworth LennoxBrittany M Strader , PA-C 10:03 AM 01/14/2016 Pager: [phone]  Patient seen, examined. Available data reviewed. Agree with findings, assessment, and plan as outlined by Randall AnBrittany Strader, PA-C. The patient is independently interviewed and examined. I have reviewed lab data, cardiac catheterization results, echo, and EKGs. I have carefully interviewed him and I think his clinical history is not consistent with acute or carditis. The patient had 4 hours of substernal pain radiating to his shoulders and down his arms. Pain resolved at the time of his heart catheterization and has not recurred. His EKG and symptoms are more consistent with non-STEMI related to coronary vasospasm. We discussed treatment options. It is very clear that he is not going to take medication. He is adamantly against taking medicine. He is willing to fill a prescription for nitroglycerin to be used as needed. I also recommended that at least initially he takes aspirin 81 mg which I think he might do. [place]. We discussed the importance of tobacco and marijuana cessation as these can contribute or cause coronary vasospasm. The  patient is counseled extensively today with his parents at the bedside.  Tonny BollmanMichael Raj Landress, M.D. 01/14/2016 11:14 AM

## 2016-01-14 NOTE — Discharge Summary (Signed)
Discharge Summary    Patient ID: Francisco Perez,  MRN: [id number], DOB/AGE: 27/01/1991 27 y.o.  Admit date: 01/13/2016 Discharge date: 01/14/2016  Primary Care Provider: No primary care provider on file. 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 male with past medical history of tobacco abuse who [facility] on 01/13/2016 as a CODE STEMI.   He developed a stabbing chest pain earlier that morning which was associated with dyspnea, nausea, and vomiting. He reported last using Marijuana the previous night but denied any Cocaine use. With his symptoms and EKG showing ST elevation in the inferolateral leads, he was taken to the catheterization lab for an urgent procedure.    Hospital Course     Consultants: None  His cath showed normal coronary arteries with normal LV function. He tolerated the procedure well with no complications noted.  An echocardiogram was performed later that day which further confirmed a normal EF with no wall motion abnormalities. His troponin values peaked at 6.14.   The following morning, he denied any repeat episodes of chest pain. Reports the pain resolved at the time of his cath, therefore lasting approximately 4 hours. His right radial cath site was without ecchymosis or evidence of a hematoma. With his reported history and elevated troponin levels, coronary vasospasm was thought to be the likely etiology of his chest pain in comparison to pericarditis. He was adamant about not taking medications at the time of discharge, but in discussion with Dr. Excell Seltzerooper he did agree to taking an 81mg  ASA daily and having an Rx for PRN SL NTG. He was educated on the association between tobacco and substance use with coronary vasospasm and discontinuation of both was strongly  encouraged.  He was discharged home in stable condition. Two-week Cardiology follow-up has been arranged at our [facility]. _____________  Discharge Vitals Blood pressure 118/65, pulse 79, temperature 97.8 F (36.6 C), temperature source Oral, resp. rate 18, weight 210 lb (95.255 kg), SpO2 99 %.  Filed Weights   01/13/16 0800  Weight: 210 lb (95.255 kg)    Labs & Radiologic Studies     CBC  Recent Labs  01/13/16 0712 01/13/16 0843 01/14/16 0303  WBC 14.3*  --  9.9  HGB 14.3 15.0 14.0  HCT 42.4 44.0 42.8  MCV 91.6  --  93.0  PLT 240  --  227   Basic Metabolic Panel  Recent Labs  01/13/16 0843 01/14/16 0303  NA 141 141  K 4.2 4.1  CL 104 105  CO2  --  27  GLUCOSE 101* 99  BUN 13 12  CREATININE 0.70 0.93  CALCIUM  --  9.3   Liver Function Tests No results for input(s): AST, ALT, ALKPHOS, BILITOT, PROT, ALBUMIN in the last 72 hours. No results for input(s): LIPASE, AMYLASE in the last 72 hours. Cardiac Enzymes  Recent Labs  01/13/16 0939 01/13/16 1444 01/13/16 2110  TROPONINI 0.54* 4.71* 6.14*   BNP Invalid input(s): POCBNP  Dg Chest Portable 1 View: 01/13/2016  CLINICAL DATA:  25 year old male with shooting pain down both arms this morning. Sharp pain in the chest starting today. Nausea. EXAM: PORTABLE CHEST 1 VIEW COMPARISON:  No priors. FINDINGS: Lung volumes are normal. No consolidative airspace disease. No  pleural effusions. No pneumothorax. No pulmonary nodule or mass noted. Pulmonary vasculature and the cardiomediastinal silhouette are within normal limits. IMPRESSION: No radiographic evidence of acute cardiopulmonary disease. Electronically Signed   By: Trudie Reed M.D.   On: 01/13/2016 08:07    Diagnostic Studies/Procedures    Echocardiogram: 01/13/2016 Study Conclusions - Left ventricle: The cavity size was normal. Systolic function was  normal. The estimated ejection fraction was in the range of 55%  to 60%. Wall motion  was normal; there were no regional wall  motion abnormalities. Left ventricular diastolic function  parameters were normal.  Cardiac Catheterization: 01/13/2016 Mr. Kateri Plummer had normal coronary arteries and low normal LV function with an EF in the 45-50% range. He didn't appear to be enlarged. The sheath was removed and a TR band was placed on the right wrist to achieve hemostasis. The patient left the lab in stable condition. Cardiac enzymes will be cycled The echocardiogram will be obtained.   Disposition   Pt is being discharged home today in good condition.  Follow-up Plans & Appointments    Follow-up Information    Follow up with Nicolasa Ducking, NP On 01/31/2016.   Specialties:  Nurse Practitioner, Cardiology, Radiology   Why:  Cardiology Hospital Follow-Up on 01/31/2016 at 10:00.   Contact information:   [address] [place] [address] [phone]      Discharge Instructions    Diet - low sodium heart healthy    Complete by:  As directed      Discharge instructions    Complete by:  As directed   PLEASE REMEMBER TO BRING ALL OF YOUR MEDICATIONS TO EACH OF YOUR FOLLOW-UP OFFICE VISITS.  PLEASE ATTEND ALL SCHEDULED FOLLOW-UP APPOINTMENTS.   Activity: Increase activity slowly as tolerated. You may shower, but no soaking baths (or swimming) for 1 week. No driving for 24 hours. No lifting over 5 lbs for 1 week. No sexual activity for 1 week.   You May Return to Work: in 1 week (if applicable)  Wound Care: You may wash cath site gently with soap and water. Keep cath site clean and dry. If you notice pain, swelling, bleeding or pus at your cath site, please call [phone].     Increase activity slowly    Complete by:  As directed            Discharge Medications   Discharge Medication List as of 01/14/2016  1:27 PM    START taking these medications   Details  aspirin 81 MG chewable tablet Chew 1 tablet (81 mg total) by mouth daily., Starting  01/14/2016, Until Discontinued, No Print    nitroGLYCERIN (NITROSTAT) 0.4 MG SL tablet Place 1 tablet (0.4 mg total) under the tongue every 5 (five) minutes as needed for chest pain., Starting 01/14/2016, Until Discontinued, Normal         Aspirin prescribed at discharge?  Yes High Intensity Statin Prescribed? (Lipitor 40-80mg  or Crestor 20-40mg ): No; Coronary Vasospasm Beta Blocker Prescribed? No: Patient refused For EF 45% or less, Was ACEI/ARB Prescribed? No: N/A ADP Receptor Inhibitor Prescribed? (i.e. Plavix etc.-Includes Medically Managed Patients): No: N/A For EF <40%, Aldosterone Inhibitor Prescribed? No: N/A Was EF assessed during THIS hospitalization? Yes; Cath and Echo Was Cardiac Rehab II ordered? (Included Medically managed Patients): No: Coronary Vasospasm   Allergies No Known Allergies   Outstanding Labs/Studies   None  Duration of Discharge Encounter   Greater than 30 minutes including physician time.  Signed, Lennart Pall  Strader, PA-C 01/14/2016, 7:14 PM

## 2016-01-14 NOTE — Telephone Encounter (Signed)
Patient still in hospital being discharged today and is aware of his follow up appointment here in our office.

## 2016-01-14 NOTE — Progress Notes (Signed)
01/14/2016 1:54 PM Discharge AVS meds taken today and those due this evening reviewed.  Follow-up appointments and when to call md reviewed.  D/C IV and TELE.  Questions and concerns addressed.   D/C home per orders. Kathryne HitchAllen, Cahterine Heinzel C

## 2016-01-31 ENCOUNTER — Encounter: Payer: Self-pay | Admitting: Nurse Practitioner

## 2016-02-12 ENCOUNTER — Encounter: Payer: Self-pay | Admitting: Nurse Practitioner

## 2016-02-12 ENCOUNTER — Encounter: Payer: Self-pay | Admitting: *Deleted

## 2016-03-26 ENCOUNTER — Encounter: Payer: Self-pay | Admitting: Physician Assistant

## 2016-03-28 ENCOUNTER — Encounter: Payer: Self-pay | Admitting: Nurse Practitioner

## 2016-03-28 ENCOUNTER — Ambulatory Visit (INDEPENDENT_AMBULATORY_CARE_PROVIDER_SITE_OTHER): Payer: Self-pay | Admitting: Nurse Practitioner

## 2016-03-28 VITALS — BP 122/82 | HR 66 | Ht 72.0 in | Wt 226.5 lb

## 2016-03-28 DIAGNOSIS — F1210 Cannabis abuse, uncomplicated: Secondary | ICD-10-CM

## 2016-03-28 DIAGNOSIS — Z720 Tobacco use: Secondary | ICD-10-CM

## 2016-03-28 DIAGNOSIS — E663 Overweight: Secondary | ICD-10-CM | POA: Insufficient documentation

## 2016-03-28 DIAGNOSIS — I201 Angina pectoris with documented spasm: Secondary | ICD-10-CM

## 2016-03-28 NOTE — Patient Instructions (Signed)
Medication Instructions:  Your physician recommends that you continue on your current medications as directed. Please refer to the Current Medication list given to you today.   Labwork: none  Testing/Procedures: none  Follow-Up: Your physician wants you to follow-up in: six months with Dr. Arida.  You will receive a reminder letter in the mail two months in advance. If you don't receive a letter, please call our office to schedule the follow-up appointment.   Any Other Special Instructions Will Be Listed Below (If Applicable).     If you need a refill on your cardiac medications before your next appointment, please call your pharmacy.   

## 2016-03-28 NOTE — Progress Notes (Signed)
Office Visit    Patient Name: Francisco Perez Date of Encounter: 03/28/2016  Primary Care Provider:  No primary care provider on file. Primary Cardiologist:  M. Kirke CorinArida, MD   Chief Complaint    25 y/o ? with a h/o STEMI in the setting of presumed vasospasm, who presents for follow-up.  Past Medical History    Past Medical History  Diagnosis Date  . Coronary vasospasm (HCC)     a. 12/2015 presented with CP and ST elevation in inferolateral leads. Cath showed normal cors and EF of 45-50%;  b. 12/2015 Echo: EF 55-60%. No rwma.  . Tobacco abuse   . Overweight    Past Surgical History  Procedure Laterality Date  . Orif tripod fracture  05/29/2012    Procedure: OPEN REDUCTION INTERNAL FIXATION (ORIF) TRIPOD FRACTURE;  Surgeon: Melvenia BeamMitchell Gore, MD;  [facility];  Service: ENT;  Laterality: Right;  . Cardiac catheterization N/A 01/13/2016    Procedure: Left Heart Cath and Coronary Angiography;  Surgeon: Runell GessJonathan J Berry, MD;  [facility];  Service: Cardiovascular;  Laterality: N/A;    Allergies  No Known Allergies  History of Present Illness    25 y/o ? with a h/o tobacco abuse.  He was [facility] to [facility] in mid-April 2017 with stabbing chest pain associated with dyspnea, nausea, and vomiting. He was found to have inferolateral ST elevation and underwent emergent cath revealing normal cors.  Echo showed nl LV fxn.  Presentation was felt to be 2/2 coronary vasospasm. He was offered long-acting CCB therapy however he refused and was subsequently d/c'd on asa and sl NTG.  He missed his transition of care appt and rescheduled for today.  Since his d/c, he has done well w/o recurrent chest pain.  He carries sl NTG with him @ all times.  He denies chest pain, palpitations, dyspnea, pnd, orthopnea, n, v, dizziness, syncope, edema, weight gain, or early satiety. Unfortunately, he continues to smoke ~ 1/2 ppd of cigarettes and continues to use marijuana.   Home Medications      Prior to Admission medications   Medication Sig Start Date End Date Taking? Authorizing Provider  aspirin 81 MG chewable tablet Chew 1 tablet (81 mg total) by mouth daily. 01/14/16  Yes Ellsworth LennoxBrittany M Strader, PA  nitroGLYCERIN (NITROSTAT) 0.4 MG SL tablet Place 1 tablet (0.4 mg total) under the tongue every 5 (five) minutes as needed for chest pain. 01/14/16  Yes Ellsworth LennoxBrittany M Strader, PA    Review of Systems    He denies chest pain, palpitations, dyspnea, pnd, orthopnea, n, v, dizziness, syncope, edema, weight gain, or early satiety. All other systems reviewed and are otherwise negative except as noted above.  Physical Exam    VS:  BP 122/82 mmHg  Pulse 66  Ht 6' (1.829 m)  Wt 226 lb 8 oz (102.74 kg)  BMI 30.71 kg/m2 , BMI Body mass index is 30.71 kg/(m^2). GEN: Well nourished, well developed, in no acute distress. HEENT: normal. Neck: Supple, no JVD, carotid bruits, or masses. Cardiac: RRR, no murmurs, rubs, or gallops. No clubbing, cyanosis, edema.  Radials/DP/PT 2+ and equal bilaterally.  Respiratory:  Respirations regular and unlabored, clear to auscultation bilaterally. GI: Soft, nontender, nondistended, BS + x 4. MS: no deformity or atrophy. Skin: warm and dry, no rash. Neuro:  Strength and sensation are intact. Psych: Normal affect.  Accessory Clinical Findings    ECG - Sinus Arrhythmia, 66, early repolarization w/ inferior TWI -  no change from 12/2015.  Assessment & Plan    1.  Coronary Vasospasm:  Admitted in 12/2015 with chest pain, elevated trop, and inferolateral ST elevation.  Cath revealed nl cors.  Nl EF on echo.  He was not interested then, and remains uninterested now, in being placed on prophylactic long acting Ca channel blocker therapy.  He has sl NTG and carries it with him @ all times.  We went over how to use it and when to call 911.  He continues to smoked cigarettes and marijuana and I recommended that he stop.  2.  Tobacco Abuse:  He cut back since his event and  is now smoking 1/2 ppd.  Complete cessation advised.  3.  Marijuana Abuse:  Complete cessation advised.  4.  Overweight:  He says that he has been putting on weight since his event in April.  He weighed 180 lbs in high school and is now up to 226.  We discussed the importance of regular exercise and calorie reduction along with the benefits of calorie counting to assist in weight loss.  5.  Disposition:  F/u with Dr. Kirke CorinArida in 6 mos or sooner if necessary.   Nicolasa Duckinghristopher Alix Stowers, NP 03/28/2016, 2:37 PM

## 2017-12-25 ENCOUNTER — Emergency Department: Payer: Self-pay

## 2017-12-25 ENCOUNTER — Encounter: Payer: Self-pay | Admitting: Emergency Medicine

## 2017-12-25 ENCOUNTER — Emergency Department
Admission: EM | Admit: 2017-12-25 | Discharge: 2017-12-25 | Disposition: A | Discharge status: Home / Self Care | Payer: Self-pay | Attending: Emergency Medicine | Admitting: Emergency Medicine

## 2017-12-25 DIAGNOSIS — Y929 Unspecified place or not applicable: Secondary | ICD-10-CM | POA: Insufficient documentation

## 2017-12-25 DIAGNOSIS — Z7982 Long term (current) use of aspirin: Secondary | ICD-10-CM | POA: Insufficient documentation

## 2017-12-25 DIAGNOSIS — Y999 Unspecified external cause status: Secondary | ICD-10-CM | POA: Insufficient documentation

## 2017-12-25 DIAGNOSIS — I252 Old myocardial infarction: Secondary | ICD-10-CM | POA: Insufficient documentation

## 2017-12-25 DIAGNOSIS — Y939 Activity, unspecified: Secondary | ICD-10-CM | POA: Insufficient documentation

## 2017-12-25 DIAGNOSIS — F17210 Nicotine dependence, cigarettes, uncomplicated: Secondary | ICD-10-CM | POA: Insufficient documentation

## 2017-12-25 DIAGNOSIS — W19XXXA Unspecified fall, initial encounter: Secondary | ICD-10-CM | POA: Insufficient documentation

## 2017-12-25 DIAGNOSIS — Z79899 Other long term (current) drug therapy: Secondary | ICD-10-CM | POA: Insufficient documentation

## 2017-12-25 DIAGNOSIS — S20212A Contusion of left front wall of thorax, initial encounter: Secondary | ICD-10-CM | POA: Insufficient documentation

## 2017-12-25 HISTORY — DX: Acute myocardial infarction, unspecified: I21.9

## 2017-12-25 MED ORDER — CYCLOBENZAPRINE HCL 10 MG PO TABS
ORAL_TABLET | ORAL | Status: AC
  Administered 2017-12-25: 10 mg via ORAL
  Filled 2017-12-25: qty 1

## 2017-12-25 MED ORDER — TRAMADOL HCL 50 MG PO TABS: 50.0000 mg | ORAL_TABLET | Freq: Four times a day (QID) | ORAL | 0 refills | Status: AC | PRN

## 2017-12-25 MED ORDER — IBUPROFEN 600 MG PO TABS: 600.0000 mg | ORAL_TABLET | Freq: Four times a day (QID) | ORAL | 0 refills | Status: AC | PRN

## 2017-12-25 MED ORDER — IBUPROFEN 400 MG PO TABS
600.0000 mg | ORAL_TABLET | Freq: Once | ORAL | Status: AC
  Administered 2017-12-25: 600 mg via ORAL

## 2017-12-25 MED ORDER — IBUPROFEN 400 MG PO TABS
ORAL_TABLET | ORAL | Status: AC
  Administered 2017-12-25: 600 mg via ORAL
  Filled 2017-12-25: qty 2

## 2017-12-25 MED ORDER — CYCLOBENZAPRINE HCL 10 MG PO TABS
10.0000 mg | ORAL_TABLET | Freq: Once | ORAL | Status: AC
  Administered 2017-12-25: 10 mg via ORAL

## 2017-12-25 MED ORDER — CYCLOBENZAPRINE HCL 10 MG PO TABS: 10.0000 mg | ORAL_TABLET | Freq: Three times a day (TID) | ORAL | 0 refills | Status: AC | PRN

## 2017-12-25 NOTE — Discharge Instructions (Addendum)
Return to the emergency department for new, worsening, or persistent severe pain, difficulty breathing, fevers, cough, weakness, or any other new or worsening symptoms that concern you.  Take the ibuprofen as the primary pain relief up to every 6 hours.  You can take Flexeril (cyclobenzaprine) on top of this if needed.  If these do not control your pain adequately, you can add on the tramadol for more severe pain.

## 2017-12-25 NOTE — ED Provider Notes (Signed)
[facility] Emergency Department Provider Note ____________________________________________   First MD Initiated Contact with Patient 12/25/17 2054     (approximate)  I have reviewed the triage vital signs and the nursing notes.   HISTORY  Chief Complaint Chest Pain    HPI Francisco Perez is a 27 y.o. male with past medical history as noted below who presents with left chest wall pain, acute onset 4 days ago after the patient fell from standing height, initially associated with shoulder pain which is since resolved.  The pain is worse with movement of the left arm, with certain positions, and with taking a deep breath.  No shortness of breath, cough, fever, or lightheadedness.   Past Medical History:  Diagnosis Date  . Coronary vasospasm (HCC)    a. 12/2015 presented with CP and ST elevation in inferolateral leads. Cath showed normal cors and EF of 45-50%;  b. 12/2015 Echo: EF 55-60%. No rwma.  . MI (myocardial infarction) (HCC)   . Overweight   . Tobacco abuse     Patient Active Problem List   Diagnosis Date Noted  . Overweight   . Elevated troponin 01/14/2016  . Coronary vasospasm (HCC) 01/14/2016  . Tobacco abuse 01/14/2016  . Chest pain 01/13/2016    Past Surgical History:  Procedure Laterality Date  . CARDIAC CATHETERIZATION N/A 01/13/2016   Procedure: Left Heart Cath and Coronary Angiography;  Surgeon: Runell Gess, MD;  Location: [facility] INVASIVE CV LAB;  Service: Cardiovascular;  Laterality: N/A;  . ORIF TRIPOD FRACTURE  05/29/2012   Procedure: OPEN REDUCTION INTERNAL FIXATION (ORIF) TRIPOD FRACTURE;  Surgeon: Melvenia Beam, MD;  Location: [facility] OR;  Service: ENT;  Laterality: Right;    Prior to Admission medications   Medication Sig Start Date End Date Taking? Authorizing Provider  aspirin 81 MG chewable tablet Chew 1 tablet (81 mg total) by mouth daily. 01/14/16   Strader, Lennart Pall, PA-C  cyclobenzaprine (FLEXERIL) 10 MG tablet Take 1  tablet (10 mg total) by mouth 3 (three) times daily as needed (pain). 12/25/17   Dionne Bucy, MD  ibuprofen (ADVIL,MOTRIN) 600 MG tablet Take 1 tablet (600 mg total) by mouth every 6 (six) hours as needed. 12/25/17   Dionne Bucy, MD  nitroGLYCERIN (NITROSTAT) 0.4 MG SL tablet Place 1 tablet (0.4 mg total) under the tongue every 5 (five) minutes as needed for chest pain. 01/14/16   Strader, Lennart Pall, PA-C  traMADol (ULTRAM) 50 MG tablet Take 1 tablet (50 mg total) by mouth every 6 (six) hours as needed for up to 5 days for severe pain. 12/25/17 12/30/17  Dionne Bucy, MD    Allergies Patient has no known allergies.  Family History  Family history unknown: Yes    Social History Social History   Tobacco Use  . Smoking status: Current Every Day Smoker    Packs/day: 0.50    Types: Cigarettes  . Smokeless tobacco: Never Used  Substance Use Topics  . Alcohol use: Yes    Alcohol/week: 0.0 oz  . Drug use: Not Currently    Review of Systems  Constitutional: No fever. Eyes: No redness. ENT: No neck pain. Cardiovascular: Positive for chest wall pain. Respiratory: Denies shortness of breath. Gastrointestinal: No abdominal pain.  Genitourinary: Negative for flank pain.  Musculoskeletal: Negative for back pain. Skin: Negative for rash. Neurological: Negative for headache.   ____________________________________________   PHYSICAL EXAM:  VITAL SIGNS: ED Triage Vitals  Enc Vitals Group     BP 12/25/17  2006 (!) 142/94     Pulse Rate 12/25/17 2006 93     Resp 12/25/17 2006 18     Temp 12/25/17 2006 98.6 F (37 C)     Temp Source 12/25/17 2006 Oral     SpO2 12/25/17 2006 94 %     Weight 12/25/17 2007 250 lb (113.4 kg)     Height 12/25/17 2007 6' (1.829 m)     Head Circumference --      Peak Flow --      Pain Score 12/25/17 2006 8     Pain Loc --      Pain Edu? --      Excl. in GC? --     Constitutional: Alert and oriented.  Slightly uncomfortable  appearing but in no acute distress. Eyes: Conjunctivae are normal.  Head: Atraumatic. Nose: No congestion/rhinnorhea. Mouth/Throat: Mucous membranes are moist.   Neck: Normal range of motion.  Cardiovascular: Normal rate, regular rhythm. Grossly normal heart sounds.  Good peripheral circulation. Respiratory: Normal respiratory effort.  No retractions. Lungs CTAB.  Left anterior chest wall tenderness below the left nipple, with no crepitus or step-off. Gastrointestinal: Soft and nontender. No distention.  Genitourinary: No flank tenderness. Musculoskeletal:   Extremities warm and well perfused.  Neurologic:  Normal speech and language. No gross focal neurologic deficits are appreciated.  Skin:  Skin is warm and dry. No rash noted. Psychiatric: Mood and affect are normal. Speech and behavior are normal.  ____________________________________________   LABS (all labs ordered are listed, but only abnormal results are displayed)  Labs Reviewed - No data to display ____________________________________________  EKG   ____________________________________________  RADIOLOGY  XR ribs left: no acute fracture  ____________________________________________   PROCEDURES  Procedure(s) performed: No  Procedures  Critical Care performed: No ____________________________________________   INITIAL IMPRESSION / ASSESSMENT AND PLAN / ED COURSE  Pertinent labs & imaging results that were available during my care of the patient were reviewed by me and considered in my medical decision making (see chart for details).  27 year old male with past medical history as noted above presents with left chest wall pain after a fall several days ago, which has persisted.  Patient has not tried to take anything at home for pain.  He has no shortness of breath.  On exam, the patient is well-appearing, vitals are normal except for slight hypertension, and the exam is as described above with reproducible  left chest wall tenderness.  X-rays obtained from triage show no acute fracture.  Differential includes rib contusion, muscle strain, or possibly minor nondisplaced fracture not seen on the x-ray.  I have no suspicion for any acute cardiac etiology or PE.  Given patient's history of coronary vasospasm I suggested to perform an EKG to fully rule out any cardiac cause, however patient declines.  He understands that without doing this we cannot rule out heart attack or other cardiac cause, although it is unlikely.  No indication for labs at this time.  I counseled the patient on medication use and on the importance of taking deep breaths and the risk of atelectasis and pneumonia.  Return precautions given, and he expresses understanding.      ____________________________________________   FINAL CLINICAL IMPRESSION(S) / ED DIAGNOSES  Final diagnoses:  Contusion of left chest wall, initial encounter      NEW MEDICATIONS STARTED DURING THIS VISIT:  New Prescriptions   CYCLOBENZAPRINE (FLEXERIL) 10 MG TABLET    Take 1 tablet (10 mg total) by mouth 3 (  three) times daily as needed (pain).   IBUPROFEN (ADVIL,MOTRIN) 600 MG TABLET    Take 1 tablet (600 mg total) by mouth every 6 (six) hours as needed.   TRAMADOL (ULTRAM) 50 MG TABLET    Take 1 tablet (50 mg total) by mouth every 6 (six) hours as needed for up to 5 days for severe pain.     Note:  This document was prepared using Dragon voice recognition software and may include unintentional dictation errors.    Dionne Bucy, MD 12/25/17 2128

## 2017-12-25 NOTE — ED Notes (Signed)
ED Provider at bedside. 

## 2017-12-25 NOTE — ED Triage Notes (Signed)
Patient states that he fell Sunday and felt something pop. Patient with complaint of central and left side rib pain.

## 2019-02-08 ENCOUNTER — Telehealth: Payer: Self-pay

## 2019-02-08 NOTE — Telephone Encounter (Signed)
Called patient from recall.  No answer. LMOV.  This is the second attempt per recall list.   

## 2019-10-08 IMAGING — CR DG RIBS W/ CHEST 3+V*L*
3 series · 3 of 3 positions shown · non-contrast
Comparison: None.

CLINICAL DATA: Central and left anterior rib pain, post fall.

EXAM:
LEFT RIBS AND CHEST - 3+ VIEW

[chest pa]
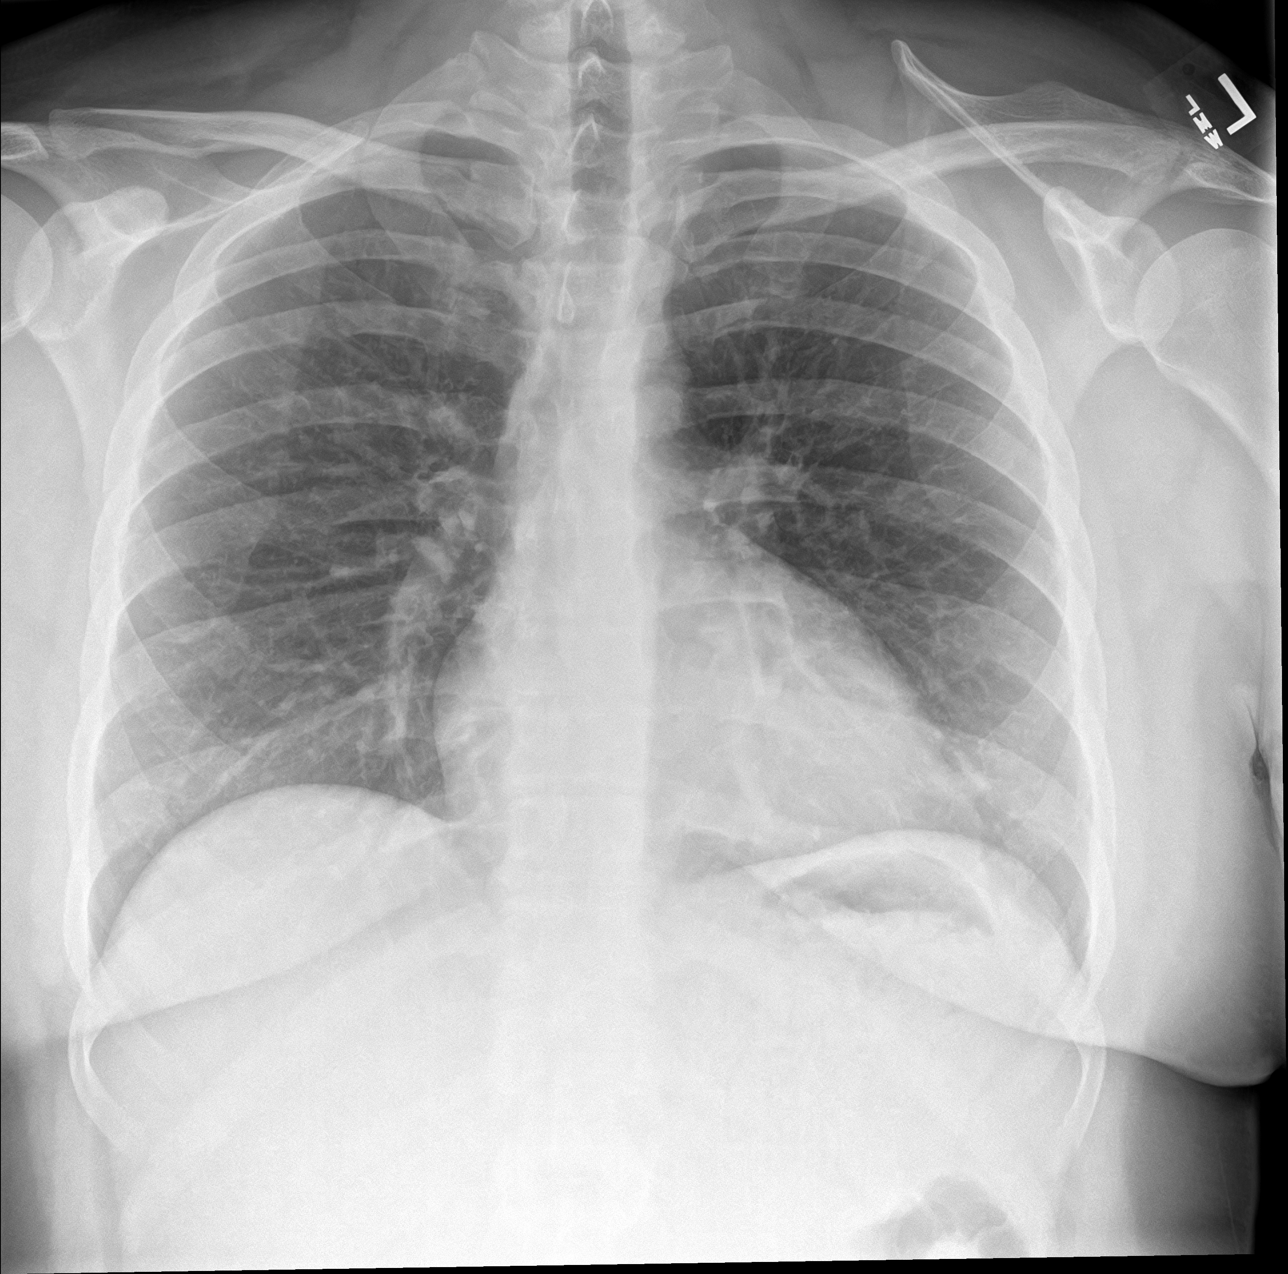

[rib pa]
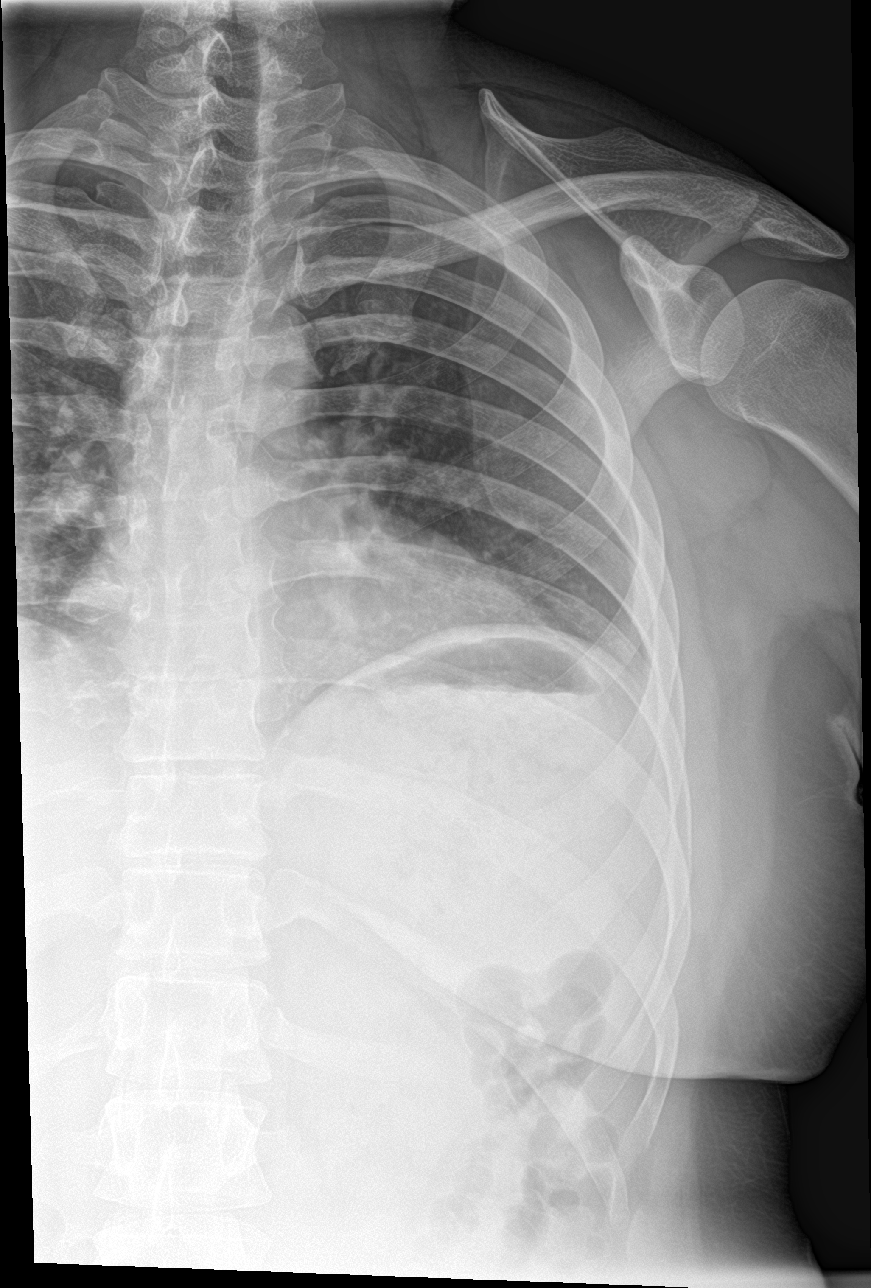

[rib pa obl]
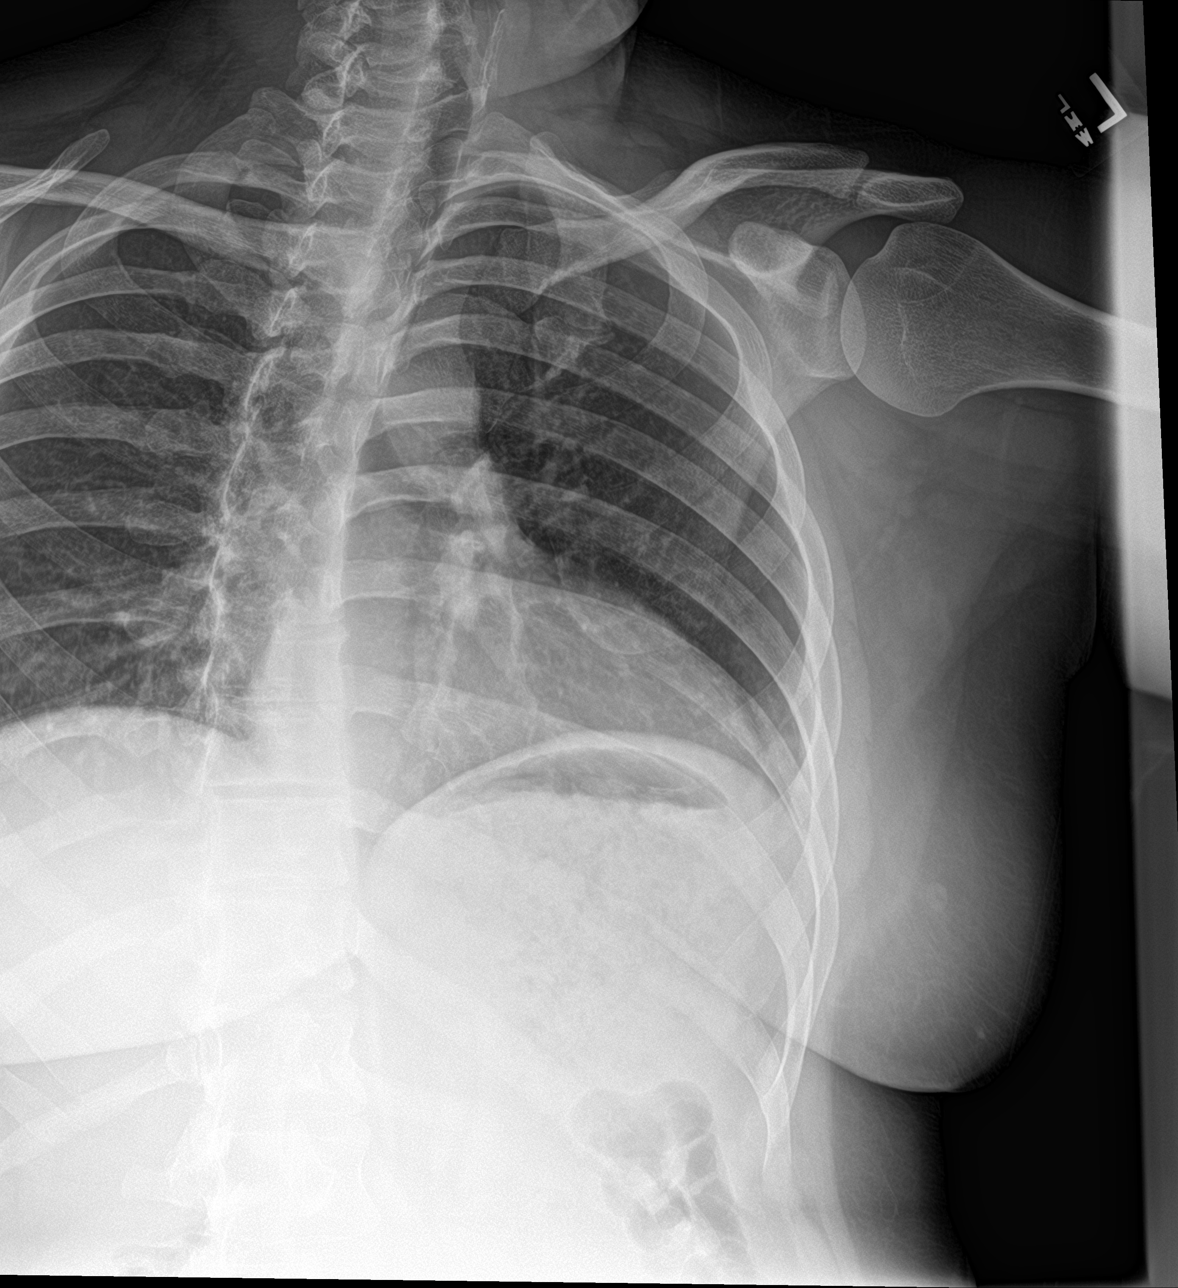

[3 of 3 positions shown; findings below may reference images not displayed]

FINDINGS: No fracture or other bone lesions are seen involving the ribs. There
is no evidence of pneumothorax or pleural effusion. Both lungs are
clear. Heart size and mediastinal contours are within normal limits.
IMPRESSION: Negative.
